# Patient Record
Sex: Female | Born: 2000 | Race: Black or African American | Hispanic: No | Marital: Single | Smoking: Never smoker
Health system: Southern US, Community
[De-identification: ages and names within clinical notes are randomized; demographics above are authoritative.]

---
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
## 2019-04-03 ENCOUNTER — Encounter (HOSPITAL_COMMUNITY): Payer: Self-pay

## 2019-04-03 ENCOUNTER — Other Ambulatory Visit: Payer: Self-pay

## 2019-04-03 ENCOUNTER — Encounter (INDEPENDENT_AMBULATORY_CARE_PROVIDER_SITE_OTHER): Payer: Self-pay

## 2019-04-03 ENCOUNTER — Ambulatory Visit (HOSPITAL_COMMUNITY)
Admission: EM | Admit: 2019-04-03 | Discharge: 2019-04-03 | Disposition: A | Discharge status: Home / Self Care | Payer: Medicaid Other | Attending: Family Medicine | Admitting: Family Medicine

## 2019-04-03 DIAGNOSIS — Z20822 Contact with and (suspected) exposure to covid-19: Secondary | ICD-10-CM | POA: Diagnosis present

## 2019-04-03 DIAGNOSIS — J029 Acute pharyngitis, unspecified: Secondary | ICD-10-CM | POA: Diagnosis present

## 2019-04-03 NOTE — Discharge Instructions (Signed)
Your COVID 19 results will be available in 48-72 hours. Negative results are immediately resulted to Mychart. All positive results are communicated with a phone call from our office.  

## 2019-04-03 NOTE — ED Triage Notes (Signed)
Patient presents to Urgent Care with complaints of sore throat since 2-3 days ago. Patient reports she has not tried any otc meds.

## 2019-04-03 NOTE — ED Provider Notes (Signed)
[facility]    CSN: [id number] Arrival date & time: 04/03/19  1713      History   Chief Complaint Chief Complaint  Patient presents with  . Sore Throat    HPI Susan Chambers is a 19 y.o. female.   HPI  Encounter for COVID-19 testing . Exposure: None. Works in [employer]. Onset of symptoms: 2 days  Symptoms include: sore throat worsened with swallowing. Previously COVID-19 tested: No She has not taken any medication for symptom. She has remained afebrile. Denies abdominal pain,nausea, or vomiting.   No past medical history on file.  There are no problems to display for this patient.   No past surgical history on file.  OB History   No obstetric history on file.      Home Medications    Prior to Admission medications   Medication Sig Start Date End Date Taking? Authorizing Provider  ibuprofen (ADVIL,MOTRIN) 400 MG tablet Take 1 tablet (400 mg total) by mouth every 6 (six) hours as needed. 02/27/13   Isaac Bliss, MD    Family History No family history on file.  Social History Social History   Tobacco Use  . Smoking status: Never Smoker  Substance Use Topics  . Alcohol use: Not on file  . Drug use: Not on file     Allergies   Penicillins Review of Systems Review of Systems  Pertinent negatives listed in HPI Physical Exam Triage Vital Signs ED Triage Vitals [04/03/19 1823]  Enc Vitals Group     BP      Pulse      Resp      Temp      Temp src      SpO2      Weight      Height      Head Circumference      Peak Flow      Pain Score 4     Pain Loc      Pain Edu?      Excl. in [place]?    No data found.  Updated Vital Signs There were no vitals taken for this visit.  Visual Acuity Right Eye Distance:   Left Eye Distance:   Bilateral Distance:    Right Eye Near:   Left Eye Near:    Bilateral Near:     Physical Exam General appearance: alert, well developed, well nourished, cooperative and in no distress Head:  Normocephalic, without obvious abnormality, atraumatic Respiratory: Respirations even and unlabored, normal respiratory rate Heart: rate and rhythm normal. No gallop or murmurs noted on exam  Extremities: No gross deformities Skin: Skin color, texture, turgor normal. No rashes seen  Psych: Appropriate mood and affect. Neurologic: Alert, oriented to person, place, and time, thought content appropriate. UC Treatments / Results  Labs (all labs ordered are listed, but only abnormal results are displayed) Labs Reviewed - No data to display  EKG   Radiology No results found.  Procedures Procedures (including critical care time)  Medications Ordered in UC Medications - No data to display  Initial Impression / Assessment and Plan / UC Course  I have reviewed the triage vital signs and the nursing notes.  Pertinent labs & imaging results that were available during my care of the patient were reviewed by me and considered in my medical decision making (see chart for details).     Encounter for COVID-19 testing. Work note provided. Encouraged salt water gargles and throat lozenges for management of sore throat  symptoms. No other concerns. Final Clinical Impressions(s) / UC Diagnoses   Final diagnoses:  Sore throat  Encounter for laboratory testing for COVID-19 virus     Discharge Instructions     Your COVID 19 results will be available in 48-72 hours. Negative results are immediately resulted to Mychart. All positive results are communicated with a phone call from our office.     ED Prescriptions    None     PDMP not reviewed this encounter.   Bing Neighbors, [place] 04/03/19 [phone]

## 2019-04-06 LAB — NOVEL CORONAVIRUS, NAA (HOSP ORDER, SEND-OUT TO REF LAB; TAT 18-24 HRS): SARS-CoV-2, NAA: NOT DETECTED

## 2019-07-29 ENCOUNTER — Emergency Department (HOSPITAL_COMMUNITY)
Admission: EM | Admit: 2019-07-29 | Discharge: 2019-07-29 | Disposition: A | Discharge status: Home / Self Care | Payer: Medicaid Other | Attending: Emergency Medicine | Admitting: Emergency Medicine

## 2019-07-29 ENCOUNTER — Other Ambulatory Visit: Payer: Self-pay

## 2019-07-29 DIAGNOSIS — M26629 Arthralgia of temporomandibular joint, unspecified side: Secondary | ICD-10-CM | POA: Diagnosis not present

## 2019-07-29 DIAGNOSIS — Z113 Encounter for screening for infections with a predominantly sexual mode of transmission: Secondary | ICD-10-CM

## 2019-07-29 DIAGNOSIS — R6884 Jaw pain: Secondary | ICD-10-CM | POA: Diagnosis present

## 2019-07-29 DIAGNOSIS — Z202 Contact with and (suspected) exposure to infections with a predominantly sexual mode of transmission: Secondary | ICD-10-CM | POA: Diagnosis not present

## 2019-07-29 LAB — WET PREP, GENITAL
Clue Cells Wet Prep HPF POC: NONE SEEN
Sperm: NONE SEEN
Trich, Wet Prep: NONE SEEN
Yeast Wet Prep HPF POC: NONE SEEN

## 2019-07-29 LAB — HIV ANTIBODY (ROUTINE TESTING W REFLEX): HIV Screen 4th Generation wRfx: NONREACTIVE

## 2019-07-29 NOTE — ED Triage Notes (Signed)
Pt c/o left jaw pains and locking for 2 years. Reports that "did something and my mom told me I needed to go get STI tested". Pt denies any discharge, odors, or other vaginal issues.

## 2019-07-29 NOTE — Discharge Instructions (Signed)
You were seen today with jaw pain which is likely related to your TMJ joint.  This is treated primarily by your dentist.  Please schedule a follow-up appointment with them and they will assist with any additional imaging and/or treatment.  In the meantime you can take Tylenol and/or Motrin by following the dosing instructions on the box.  I have also performed some screening tests for sexually transmitted infections.  These tests will come back in the next 1 to 2 days and the results will be available in the MyChart app.  There is information in the discharge paperwork about setting up this app on your phone to follow the results.  You will be called with any positive results to return and start treatment.

## 2019-07-29 NOTE — ED Notes (Signed)
ED Provider at bedside. 

## 2019-07-29 NOTE — ED Provider Notes (Signed)
Emergency Department Provider Note   I have reviewed the triage vital signs and the nursing notes.   HISTORY  Chief Complaint Jaw Pain (left) and STD check   HPI Susan Chambers is a 19 y.o. female presents to the ED with chronic jaw pain and clicking and for STI screening.  In terms of the jaw pain patient states that she has had clicking and popping frequently since her wisdom teeth were removed in 2018.  The symptoms are not worsening but have been persistent.  She denies any episodes where her dog is completely stuck open but sometimes feels that it may be stuck in the closed position briefly but then she is able to open it with some clicking sounds. She has tried to schedule with her dentist but with COVID appointments have been limited.   Patient is also enquiring about STI screening. She last had sex with her girlfriend in March. She denies any vaginal bleeding, discharge, or pain. No recent high risk encounters. No dysuria, hesitancy, or urgency. No fever. She has not been alerted that she has had STI exposure.   No past medical history on file.  There are no problems to display for this patient.   No past surgical history on file.  Allergies Penicillins  Family History  Problem Relation Age of Onset  . Healthy Mother     Social History Social History   Tobacco Use  . Smoking status: Never Smoker  . Smokeless tobacco: Never Used  Substance Use Topics  . Alcohol use: Not Currently  . Drug use: Not on file    Review of Systems  Constitutional: No fever/chills Eyes: No visual changes. ENT: No sore throat. Positive jaw clicking and pain.  Cardiovascular: Denies chest pain. Respiratory: Denies shortness of breath. Gastrointestinal: No abdominal pain.  No nausea, no vomiting.  No diarrhea.  No constipation. Genitourinary: Negative for dysuria. Musculoskeletal: Negative for back pain. Skin: Negative for rash. Neurological: Negative for headaches, focal  weakness or numbness.  10-point ROS otherwise negative.  ____________________________________________   PHYSICAL EXAM:  VITAL SIGNS: ED Triage Vitals [07/29/19 1151]  Enc Vitals Group     BP (!) 127/96     Pulse Rate 86     Resp 16     Temp 98.1 F (36.7 C)     Temp src      SpO2 100 %   Constitutional: Alert and oriented. Well appearing and in no acute distress. Eyes: Conjunctivae are normal.  Head: Atraumatic. Nose: No congestion/rhinnorhea. Mouth/Throat: Mucous membranes are moist.  Oropharynx non-erythematous. No visible masses.  Clicking with opening the jaw mainly on the left.  No findings to support jaw dislocation.  Neck: No stridor.  Cardiovascular: Normal rate, regular rhythm. Good peripheral circulation. Grossly normal heart sounds.   Respiratory: Normal respiratory effort.  No retractions. Lungs CTAB. Gastrointestinal: Soft and nontender. No distention.  GU: Pelvic exam performed with patient's verbal consent and nurse tech chaperone.  No vaginal bleeding.  Mild discharge.  No discomfort on exam. Normal appearing cervix.  Musculoskeletal: No gross deformities of extremities. Neurologic:  Normal speech and language.  Skin:  Skin is warm, dry and intact. No rash noted.   ____________________________________________   LABS (all labs ordered are listed, but only abnormal results are displayed)  Labs Reviewed  WET PREP, GENITAL - Abnormal; Notable for the following components:      Result Value   WBC, Wet Prep HPF POC MODERATE (*)    All other components  within normal limits  HIV ANTIBODY (ROUTINE TESTING W REFLEX)  RPR  GC/CHLAMYDIA PROBE AMP ([place]) NOT AT [facility]   ____________________________________________   PROCEDURES  Procedure(s) performed:   Procedures  None  ____________________________________________   INITIAL IMPRESSION / ASSESSMENT AND PLAN / ED COURSE  Pertinent labs & imaging results that were available during my care of the  patient were reviewed by me and considered in my medical decision making (see chart for details).   Patient with clicking and pain at the TMJ seeming worse on the left.  No evidence of jaw dislocation.  Discussed that she will need to follow with her dentist for further management of this.  No emergent imaging at this time.  Advised over-the-counter anti-inflammatories and close follow-up.  In terms of STI screening I offered screening here in the ER but patient is not having symptoms.  Pelvic exam performed as above with patient consent and chaperone.  Will not empirically treat but will wait on test results.  Have also sent HIV and RPR testing.  Patient to follow test results in the MyChart app. Info provided at discharge regarding setting this app up on her phone.   Gave contact information for dentistry on call as well.  ____________________________________________  FINAL CLINICAL IMPRESSION(S) / ED DIAGNOSES  Final diagnoses:  Arthralgia of temporomandibular joint, unspecified laterality  Screening examination for STD (sexually transmitted disease)    Note:  This document was prepared using Dragon voice recognition software and may include unintentional dictation errors.  Alona Bene, MD, [facility] Emergency Medicine    Avyana Puffenbarger, Arlyss Repress, MD 07/30/19 [phone]

## 2019-07-29 NOTE — ED Notes (Signed)
Discharge paperwork reviewed with pt.  Pt requested referral for dentist.  EDP made aware and subsequently provided requested referral.  Pt with no other questions or concerns, ambulatory at time of discharge.

## 2019-07-30 LAB — GC/CHLAMYDIA PROBE AMP (~~LOC~~) NOT AT ARMC
Chlamydia: NEGATIVE
Comment: NEGATIVE
Comment: NORMAL
Neisseria Gonorrhea: NEGATIVE

## 2019-07-30 LAB — RPR: RPR Ser Ql: NONREACTIVE

## 2019-09-26 ENCOUNTER — Emergency Department (HOSPITAL_COMMUNITY)
Admission: EM | Admit: 2019-09-26 | Discharge: 2019-09-27 | Disposition: A | Discharge status: Home / Self Care | Payer: Medicaid Other | Attending: Emergency Medicine | Admitting: Emergency Medicine

## 2019-09-26 ENCOUNTER — Other Ambulatory Visit: Payer: Self-pay

## 2019-09-26 ENCOUNTER — Emergency Department (HOSPITAL_COMMUNITY): Payer: Medicaid Other

## 2019-09-26 DIAGNOSIS — S6991XA Unspecified injury of right wrist, hand and finger(s), initial encounter: Secondary | ICD-10-CM

## 2019-09-26 DIAGNOSIS — M79672 Pain in left foot: Secondary | ICD-10-CM

## 2019-09-26 DIAGNOSIS — M79641 Pain in right hand: Secondary | ICD-10-CM | POA: Diagnosis present
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
## 2019-09-27 LAB — I-STAT BETA HCG BLOOD, ED (MC, WL, AP ONLY): I-stat hCG, quantitative: <5 m[IU]/mL (ref <5)

## 2019-09-27 MED ORDER — IBUPROFEN 400 MG PO TABS
600.0000 mg | ORAL_TABLET | Freq: Once | ORAL | Status: AC
  Administered 2019-09-27: 04:00:00 600 mg via ORAL
  Filled 2019-09-27: qty 1
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
## 2019-09-27 NOTE — Progress Notes (Signed)
Orthopedic Tech Progress Note Patient Details:  Susan Chambers January 23, 2001 [id number]  Ortho Devices Type of Ortho Device: Arm sling, Ulna gutter splint Ortho Device/Splint Location: rue Ortho Device/Splint Interventions: Ordered, Application, Adjustment   Post Interventions Patient Tolerated: Well Instructions Provided: Care of device, Adjustment of device   [name] Post 09/27/2019, 4:42 AM

## 2019-09-27 NOTE — Discharge Instructions (Addendum)
Thank you for allowing me to care for you today in the Emergency Department.   Take 650 mg of Tylenol or 600 mg of ibuprofen with food every 6 hours for pain.  You can alternate between these 2 medications every 3 hours if your pain returns.  For instance, you can take Tylenol at noon, followed by a dose of ibuprofen at 3, followed by second dose of Tylenol and 6.  Please keep the splint on and in place until you can follow-up with orthopedics in the office.  As we discussed, we did not see a fracture/broken bone on the x-ray today, but based on your exam and mechanism of injury, we are treating it as if you have a boxer's fracture.  You should have a repeat x-ray performed in about a week. Call to schedule a follow-up appoint with orthopedics.  Keep the splint clean and dry.  You can cover the splint with a plastic bag when you are bathing or showering to prevent it from getting wet.  You should return to the emergency department if you have any fall, injury, if you feel unsafe at home, if your fingers turn blue, if your forearm gets very red and swollen, or develop other new, concerning symptoms.

## 2019-11-02 ENCOUNTER — Other Ambulatory Visit: Payer: Self-pay

## 2019-11-02 ENCOUNTER — Other Ambulatory Visit: Payer: Medicaid Other

## 2019-11-02 DIAGNOSIS — Z20822 Contact with and (suspected) exposure to covid-19: Secondary | ICD-10-CM

## 2019-11-03 LAB — NOVEL CORONAVIRUS, NAA: SARS-CoV-2, NAA: NOT DETECTED

## 2019-11-03 LAB — SARS-COV-2, NAA 2 DAY TAT

## 2019-11-05 ENCOUNTER — Other Ambulatory Visit: Payer: Self-pay

## 2019-11-05 ENCOUNTER — Other Ambulatory Visit: Payer: Medicaid Other

## 2019-11-05 ENCOUNTER — Other Ambulatory Visit: Payer: Self-pay | Admitting: *Deleted

## 2019-11-05 DIAGNOSIS — Z20822 Contact with and (suspected) exposure to covid-19: Secondary | ICD-10-CM

## 2019-11-06 ENCOUNTER — Telehealth: Payer: Self-pay

## 2019-11-06 LAB — SARS-COV-2, NAA 2 DAY TAT

## 2019-11-06 LAB — NOVEL CORONAVIRUS, NAA: SARS-CoV-2, NAA: NOT DETECTED

## 2019-11-06 NOTE — Telephone Encounter (Signed)
Pt  called for covid results -advised results are not back.  

## 2019-11-07 ENCOUNTER — Telehealth: Payer: Self-pay

## 2019-11-07 NOTE — Telephone Encounter (Signed)
Called and informed patient that test for Covid 19 was NEGATIVE. Discussed signs and symptoms of Covid 19 : fever, chills, respiratory symptoms, cough, ENT symptoms, sore throat, SOB, muscle pain, diarrhea, headache, loss of taste/smell, close exposure to COVID-19 patient. Pt instructed to call PCP if they develop the above signs and sx. Pt also instructed to call 911 if having respiratory issues/distress. Discussed MyChart enrollment. Pt verbalized understanding.  

## 2019-12-09 ENCOUNTER — Ambulatory Visit
Admission: RE | Admit: 2019-12-09 | Discharge: 2019-12-09 | Disposition: A | Discharge status: Home / Self Care | Payer: Medicaid Other | Source: Ambulatory Visit | Attending: Family Medicine | Admitting: Family Medicine

## 2019-12-09 ENCOUNTER — Other Ambulatory Visit: Payer: Self-pay | Admitting: Physician Assistant

## 2019-12-09 ENCOUNTER — Other Ambulatory Visit: Payer: Self-pay

## 2019-12-09 DIAGNOSIS — T1490XA Injury, unspecified, initial encounter: Secondary | ICD-10-CM

## 2020-03-22 ENCOUNTER — Other Ambulatory Visit: Payer: Medicaid Other

## 2020-03-22 DIAGNOSIS — Z20822 Contact with and (suspected) exposure to covid-19: Secondary | ICD-10-CM

## 2020-03-24 LAB — SARS-COV-2, NAA 2 DAY TAT

## 2020-03-24 LAB — NOVEL CORONAVIRUS, NAA: SARS-CoV-2, NAA: NOT DETECTED

## 2022-05-04 IMAGING — DX DG HAND COMPLETE 3+V*R*
3 series · 3 of 3 positions shown · non-contrast
Comparison: None.

CLINICAL DATA: Altercation, swelling

EXAM:
RIGHT HAND - COMPLETE 3+ VIEW

[hand pa]
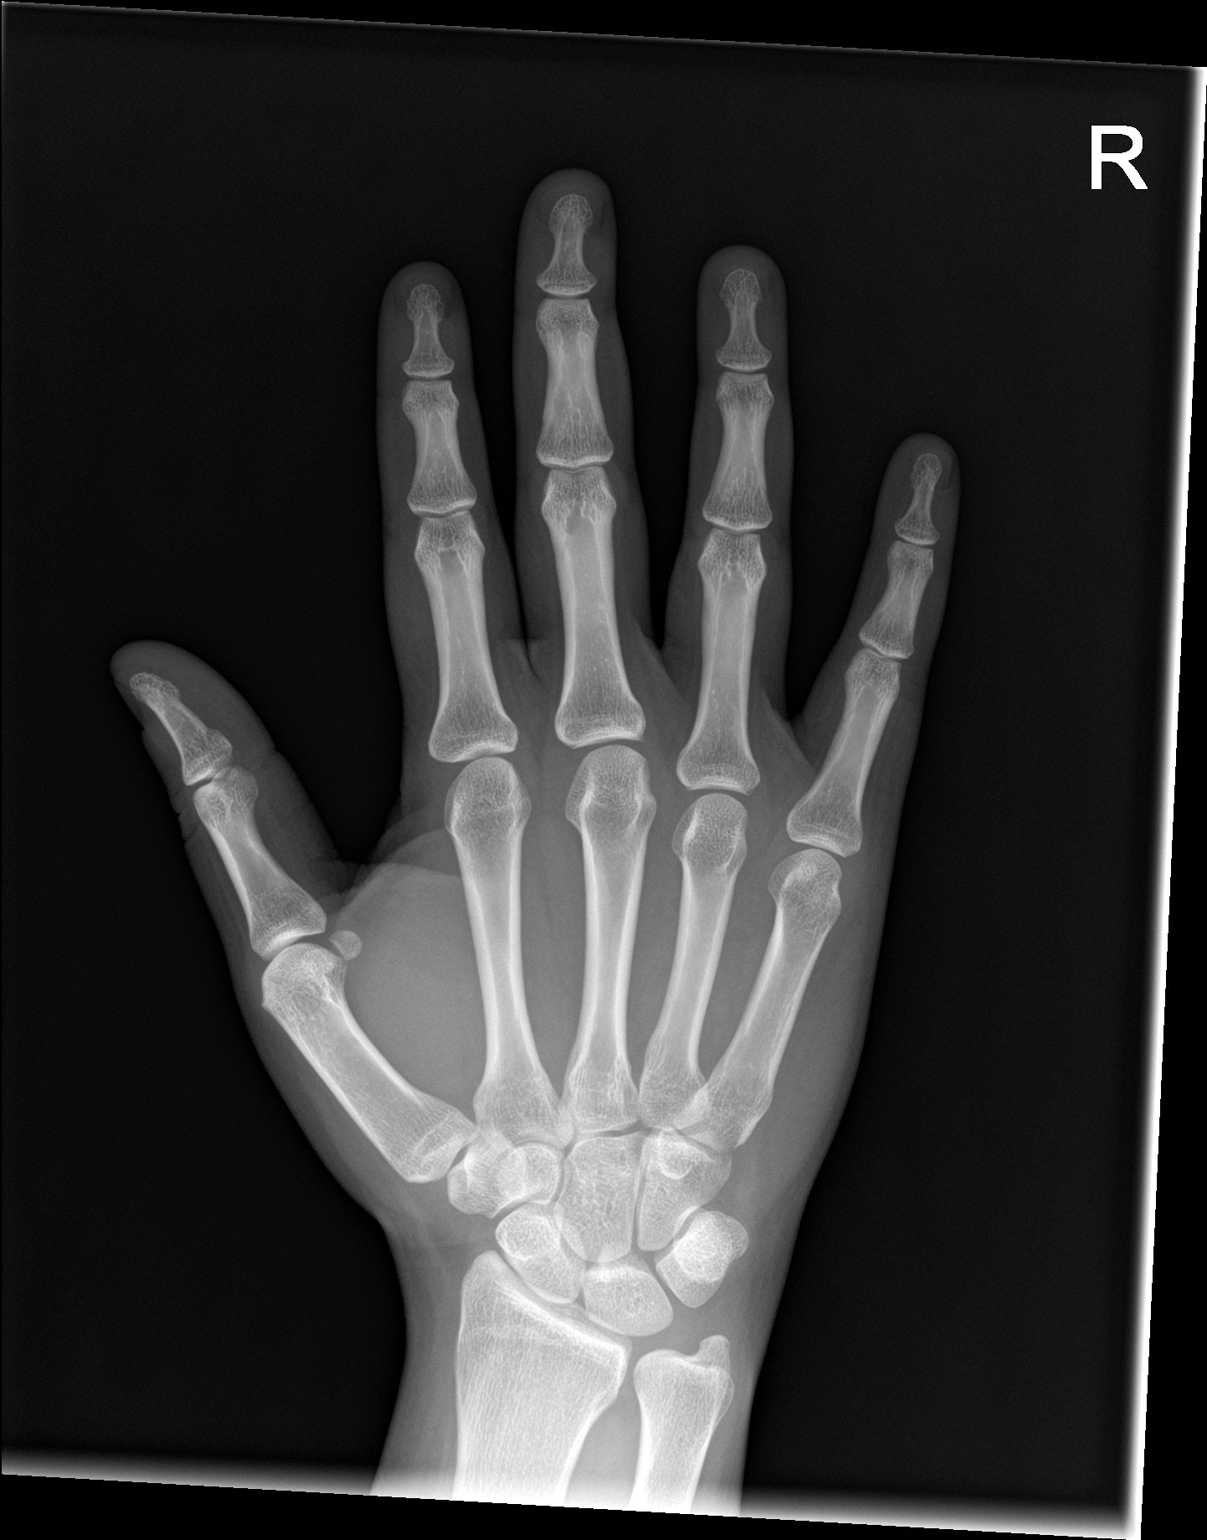

[hand obl]
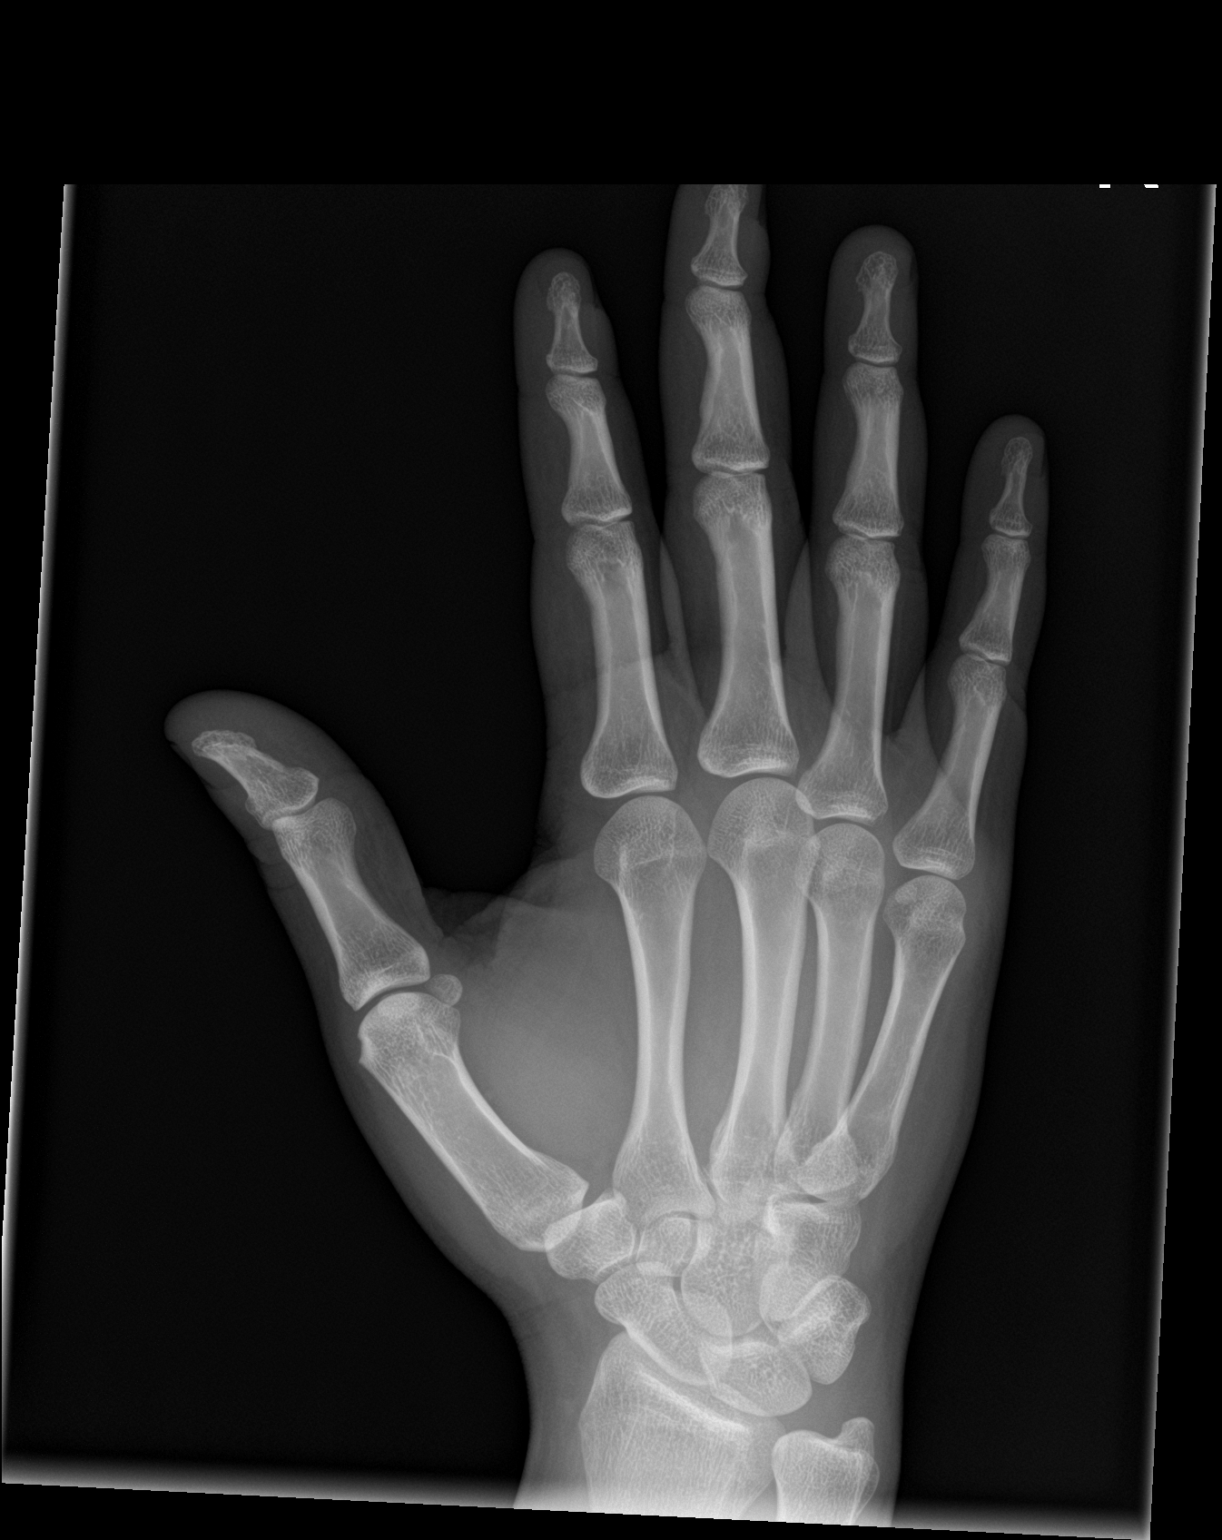

[hand lat]
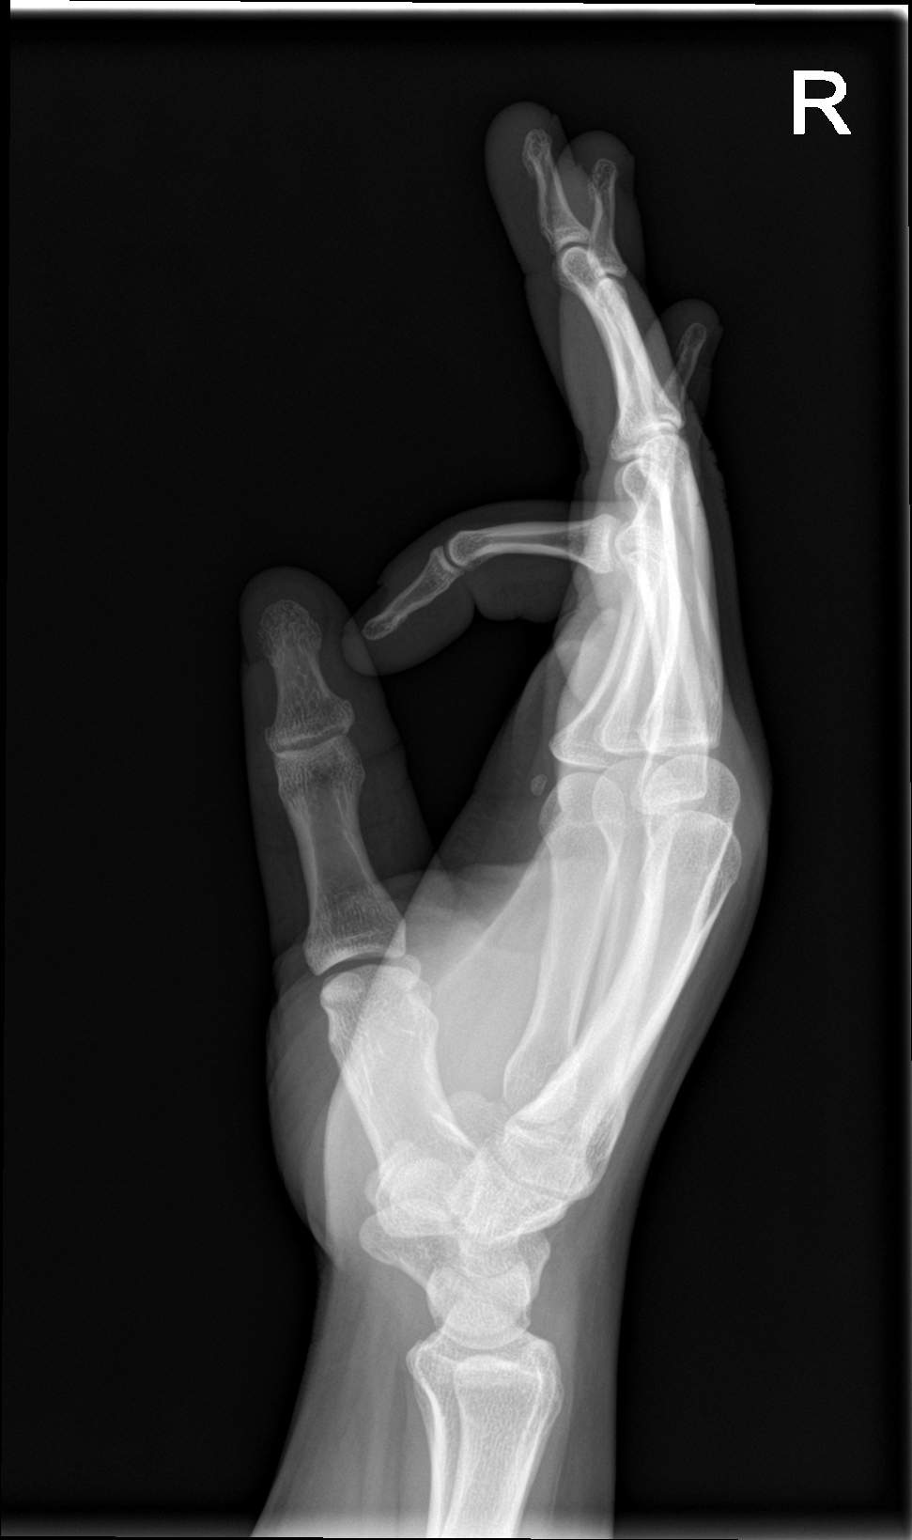

[3 of 3 positions shown; findings below may reference images not displayed]

FINDINGS: Frontal, oblique, and lateral views of the right hand are obtained.
No fracture, subluxation, or dislocation. Joint spaces are well
preserved. Soft tissues are normal.
IMPRESSION: 1. Unremarkable right hand.
